# Patient Record
Sex: Female | Born: 1995 | Race: Black or African American | Hispanic: No | Marital: Married | Smoking: Never smoker
Health system: Southern US, Community
[De-identification: ages and names within clinical notes are randomized; demographics above are authoritative.]

## PROBLEM LIST (undated history)

## (undated) DIAGNOSIS — N83209 Unspecified ovarian cyst, unspecified side: Secondary | ICD-10-CM

## (undated) DIAGNOSIS — E7800 Pure hypercholesterolemia, unspecified: Secondary | ICD-10-CM

---

## 2014-09-16 ENCOUNTER — Emergency Department (HOSPITAL_COMMUNITY)
Admission: EM | Admit: 2014-09-16 | Discharge: 2014-09-16 | Disposition: A | Discharge status: Home / Self Care | Payer: BLUE CROSS/BLUE SHIELD | Attending: Emergency Medicine | Admitting: Emergency Medicine

## 2014-09-16 ENCOUNTER — Emergency Department (HOSPITAL_COMMUNITY): Payer: BLUE CROSS/BLUE SHIELD

## 2014-09-16 ENCOUNTER — Encounter (HOSPITAL_COMMUNITY): Payer: Self-pay | Admitting: Emergency Medicine

## 2014-09-16 DIAGNOSIS — Z3A00 Weeks of gestation of pregnancy not specified: Secondary | ICD-10-CM | POA: Diagnosis not present

## 2014-09-16 DIAGNOSIS — O209 Hemorrhage in early pregnancy, unspecified: Secondary | ICD-10-CM | POA: Diagnosis present

## 2014-09-16 DIAGNOSIS — N939 Abnormal uterine and vaginal bleeding, unspecified: Secondary | ICD-10-CM

## 2014-09-16 DIAGNOSIS — IMO0002 Reserved for concepts with insufficient information to code with codable children: Secondary | ICD-10-CM

## 2014-09-16 LAB — CBC WITH DIFFERENTIAL/PLATELET
BASOS ABS: 0 10*3/uL (ref 0.0–0.1)
BASOS PCT: 1 % (ref 0–1)
EOS ABS: 0.1 10*3/uL (ref 0.0–0.7)
Eosinophils Relative: 1 % (ref 0–5)
HCT: 36 % (ref 36.0–46.0)
HEMOGLOBIN: 12 g/dL (ref 12.0–15.0)
Lymphocytes Relative: 42 % (ref 12–46)
Lymphs Abs: 2.5 10*3/uL (ref 0.7–4.0)
MCH: 30.1 pg (ref 26.0–34.0)
MCHC: 33.3 g/dL (ref 30.0–36.0)
MCV: 90.2 fL (ref 78.0–100.0)
Monocytes Absolute: 0.4 10*3/uL (ref 0.1–1.0)
Monocytes Relative: 6 % (ref 3–12)
NEUTROS PCT: 50 % (ref 43–77)
Neutro Abs: 3 10*3/uL (ref 1.7–7.7)
Platelets: 336 10*3/uL (ref 150–400)
RBC: 3.99 MIL/uL (ref 3.87–5.11)
RDW: 12.7 % (ref 11.5–15.5)
WBC: 5.9 10*3/uL (ref 4.0–10.5)

## 2014-09-16 LAB — WET PREP, GENITAL
Clue Cells Wet Prep HPF POC: NONE SEEN
TRICH WET PREP: NONE SEEN
YEAST WET PREP: NONE SEEN

## 2014-09-16 LAB — COMPREHENSIVE METABOLIC PANEL
ALBUMIN: 3.4 g/dL — AB (ref 3.5–5.0)
ALK PHOS: 51 U/L (ref 38–126)
ALT: 11 U/L — AB (ref 14–54)
ANION GAP: 7 (ref 5–15)
AST: 17 U/L (ref 15–41)
BUN: 15 mg/dL (ref 6–20)
CALCIUM: 8.7 mg/dL — AB (ref 8.9–10.3)
CO2: 20 mmol/L — AB (ref 22–32)
Chloride: 111 mmol/L (ref 101–111)
Creatinine, Ser: 0.89 mg/dL (ref 0.44–1.00)
GFR calc Af Amer: >60 mL/min (ref >60)
GFR calc non Af Amer: >60 mL/min (ref >60)
GLUCOSE: 60 mg/dL — AB (ref 65–99)
Potassium: 3.8 mmol/L (ref 3.5–5.1)
SODIUM: 138 mmol/L (ref 135–145)
Total Bilirubin: 0.4 mg/dL (ref 0.3–1.2)
Total Protein: 6.3 g/dL — ABNORMAL LOW (ref 6.5–8.1)

## 2014-09-16 LAB — URINE MICROSCOPIC-ADD ON

## 2014-09-16 LAB — URINALYSIS, ROUTINE W REFLEX MICROSCOPIC
BILIRUBIN URINE: NEGATIVE
Glucose, UA: NEGATIVE mg/dL
Ketones, ur: NEGATIVE mg/dL
NITRITE: NEGATIVE
PH: 7 (ref 5.0–8.0)
Protein, ur: 100 mg/dL — AB
SPECIFIC GRAVITY, URINE: 1.03 (ref 1.005–1.030)
Urobilinogen, UA: 1 mg/dL (ref 0.0–1.0)

## 2014-09-16 LAB — GC/CHLAMYDIA PROBE AMP (~~LOC~~) NOT AT ARMC
CHLAMYDIA, DNA PROBE: NEGATIVE
Neisseria Gonorrhea: NEGATIVE

## 2014-09-16 LAB — HCG, QUANTITATIVE, PREGNANCY: hCG, Beta Chain, Quant, S: 4004 m[IU]/mL — ABNORMAL HIGH (ref <5)

## 2014-09-16 LAB — POC URINE PREG, ED: Preg Test, Ur: POSITIVE — AB

## 2014-09-16 LAB — ABO/RH: ABO/RH(D): B POS

## 2014-09-16 MED ORDER — IBUPROFEN 800 MG PO TABS: 800.0000 mg | ORAL_TABLET | Freq: Three times a day (TID) | ORAL | Status: DC | PRN

## 2014-09-16 MED ORDER — MISOPROSTOL 200 MCG PO TABS: 600.0000 ug | ORAL_TABLET | Freq: Once | ORAL | Status: DC

## 2014-09-16 MED ORDER — ONDANSETRON 4 MG PO TBDP: 4.0000 mg | ORAL_TABLET | Freq: Three times a day (TID) | ORAL | Status: DC | PRN

## 2014-09-16 NOTE — Discharge Instructions (Signed)
You were seen in the emergency for vaginal bleeding. It appears on ultrasound that you still have retained products of conception. I recommend you take another dose of Cytotec 600 micrograms. You may take this either by mouth or Place these pills inside of your vagina. I recommend you follow-up with women's clinic for close follow-up to make sure that the Cytotec works. If the Cytotec does not clear all the products of conception, you may need a surgical procedure SEEK IMMEDIATE MEDICAL CARE IF:   You pass out.   You are changing pads every 15 to 30 minutes.   You have abdominal pain.  You have a fever.   You become sweaty or weak.   You are passing large blood clots from the vagina.   You start to feel nauseous and vomit. MAKE SURE YOU:   Understand these instructions.  Will watch your condition.  Will get help right away if you are not doing well or get worse. Document Released: 12/20/2004 Document Revised: 12/25/2012 Document Reviewed: 07/19/2012 [facility] Patient Information 2015 [place], [place]. This information is not intended to replace advice given to you by your health care provider. Make sure you discuss any questions you have with your health care provider.

## 2014-09-16 NOTE — ED Provider Notes (Signed)
This chart was scribed for  Layla Maw Kleber Crean, DO by Bethel Born, ED Scribe. This patient was seen in room D30C/D30C and the patient's care was started at 3:28 AM.  TIME SEEN: 3:28 AM  CHIEF COMPLAINT: Vaginal bleeding  HPI: Stacie Patterson is a 19 y.o. female who presents to the Emergency Department complaining of vaginal bleeding with onset 3 weeks ago (08/29/14) after a medical abortion at [facility]. The bleeding had slowed down but increased yesterday. She has been passing clots and used 4 heavy pads today. Movement exacerbates the bleeding. Associated symptoms include abdominal cramping. Pt denies dysuria, hematuria, and other vaginal discharge. She followed up at [facility] 2 days ago where she had an ultrasound and pelvic exam and was told that her uterus was empty. LNMP 07/13/14. G1P0A1. No STD history. States she did have intercourse once after her abortion.  ROS: See HPI Constitutional: no fever  Eyes: no drainage  ENT: no runny nose   Cardiovascular:  no chest pain  Resp: no SOB  GI: no vomiting GU: no dysuria Integumentary: no rash  Allergy: no hives  Musculoskeletal: no leg swelling  Neurological: no slurred speech ROS otherwise negative  PAST MEDICAL HISTORY/PAST SURGICAL HISTORY:  History reviewed. No pertinent past medical history.  MEDICATIONS:  Prior to Admission medications   Not on File    ALLERGIES:  No Known Allergies  SOCIAL HISTORY:  Social History  Substance Use Topics  . Smoking status: Never Smoker   . Smokeless tobacco: Not on file  . Alcohol Use: No    FAMILY HISTORY: No family history on file.  EXAM: BP 113/61 mmHg  Pulse 78  Temp(Src) 98.1 F (36.7 C) (Oral)  Resp 16  Ht 5' 10.5" (1.791 m)  Wt 205 lb (92.987 kg)  BMI 28.99 kg/m2  SpO2 100%  LMP 07/13/2014 CONSTITUTIONAL: Alert and oriented and responds appropriately to questions. Well-appearing; well-nourished HEAD: Normocephalic EYES: Conjunctivae clear, PERRL ENT: normal nose;  no rhinorrhea; moist mucous membranes; pharynx without lesions noted NECK: Supple, no meningismus, no LAD  CARD: RRR; S1 and S2 appreciated; no murmurs, no clicks, no rubs, no gallops RESP: Normal chest excursion without splinting or tachypnea; breath sounds clear and equal bilaterally; no wheezes, no rhonchi, no rales, no hypoxia or respiratory distress, speaking full sentences ABD/GI: Normal bowel sounds; non-distended; soft, non-tender, no rebound, no guarding, no peritoneal signs GU:  Normal external genitalia, mild to moderate amount of dark red blood coming from the cervical os without clots, no hemorrhage, no adnexal tenderness or fullness, no cervical motion tenderness, no other vaginal discharge noted, cervix is not dilated BACK:  The back appears normal and is non-tender to palpation, there is no CVA tenderness EXT: Normal ROM in all joints; non-tender to palpation; no edema; normal capillary refill; no cyanosis, no calf tenderness or swelling    SKIN: Normal color for age and race; warm NEURO: Moves all extremities equally, sensation to light touch intact diffusely, cranial nerves II through XII intact PSYCH: The patient's mood and manner are appropriate. Grooming and personal hygiene are appropriate.  MEDICAL DECISION MAKING: Pt here with increasing vaginal bleeding after abortion on August 26. Reports she took pills. Was seen at [facility] 2 days ago and states she had an ultrasound which showed that her "uterus was empty". She did have sexual intercourse once after her abortion. No cervical motion tenderness, adnexal tenderness or fullness on exam. Labs ordered in triage are unremarkable. Pelvic cultures and urine pending. Discussed with patient  that this bleeding could be from her next menstrual cycle but given recent negative ultrasound I have low suspicion that there retained products of conception.  ED PROGRESS: Patient's urine pregnancy test is positive. A quantitative hCG is  greater than 4000. Concern that patient may have a new pregnancy versus retained product of conception. Will obtain pelvic ultrasound. Her wet prep is negative except for few WBCs. Gonorrhea and chlamydia cultures are pending. Urine shows too numerous to count RBCs and small leukocytes likely from her vaginal bleeding. Rare bacteria seen.  She is B+, does not need RhoGAM.   7:15 AM  Pt's pelvic ultrasound shows findings concerning for retained products of conception with heterogeneous debris within a thickened endometrium. She also has cystic tubular structures on both ovaries larger on the right than the left have appearance of hydrosalpinx. She had no adnexal tenderness on exam. Gonorrhea and chlamydia cultures are pending. Have discussed the findings of retained products of conception with Dr. Adrian Blackwater with OB/GYN. He recommends giving the patient another 600 mcg of Cytotec to take either orally or intravaginally. He recommends as outpatient follow-up with women's hospital as she may need a D&C.  Discussed these findings with patient. I feel she is safe to be discharged home. Her bleeding here in the emergency department has been mild and she has a hemoglobin of 12 and is hemodynamically stable. Discussed bleeding return precautions with patient. Will get outpatient follow-up information. We'll discharge with Cytotec and Zofran. She verbalized understanding and is comfortable with this plan.       I personally performed the services described in this documentation, which was scribed in my presence. The recorded information has been reviewed and is accurate.      Layla Maw Dorethea Strubel, DO 09/16/14 [phone]

## 2014-09-16 NOTE — ED Notes (Signed)
Pt back from US

## 2014-09-16 NOTE — ED Notes (Signed)
Pt. reports heavy vaginal bleeding with low abdominal cramping onset several weeks ago.  Abortion last 08/29/2014 , denies fever or chills. No dysuria .

## 2014-09-16 NOTE — ED Notes (Signed)
Patient transported to Ultrasound 

## 2016-04-13 ENCOUNTER — Ambulatory Visit (HOSPITAL_COMMUNITY)
Admission: EM | Admit: 2016-04-13 | Discharge: 2016-04-13 | Disposition: A | Discharge status: Home / Self Care | Payer: BLUE CROSS/BLUE SHIELD

## 2017-05-02 IMAGING — US US OB TRANSVAGINAL
1 series · 13 of 28 positions shown · non-contrast
Comparison: None.

CLINICAL DATA: Persistent vaginal bleeding and positive pregnancy
test post medical abortion 08/29/2014.

EXAM:
OBSTETRIC <14 WK US AND TRANSVAGINAL OB US
TECHNIQUE: Both transabdominal and transvaginal ultrasound examinations were
performed for complete evaluation of the gestation as well as the
maternal uterus, adnexal regions, and pelvic cul-de-sac.
Transvaginal technique was performed to assess early pregnancy.

[Series 1: us ob transvaginal · 0.24mm/px · 13 of 68 slices shown]
[im 3/68]
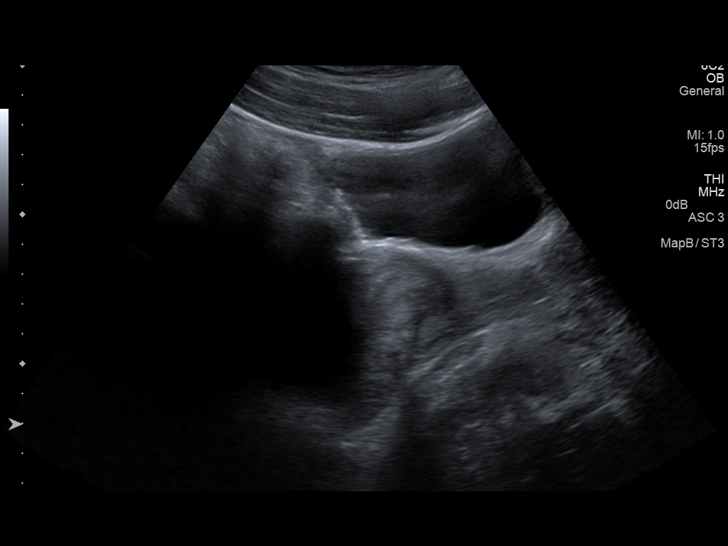
[im 8/68]
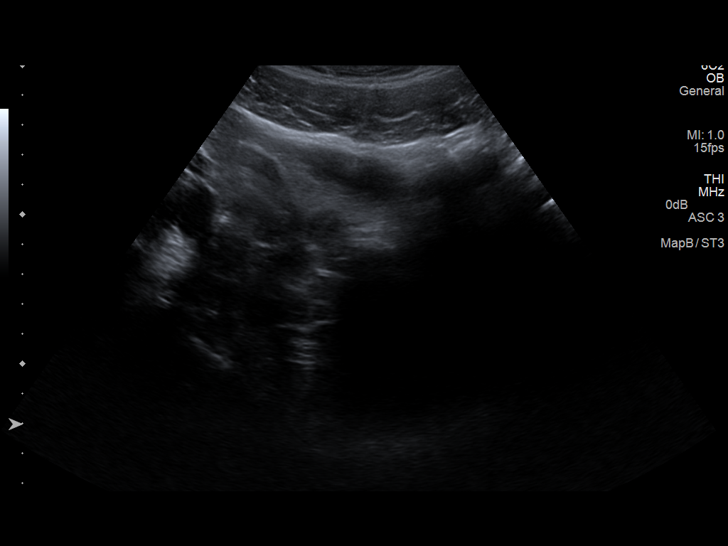
[im 13/68]
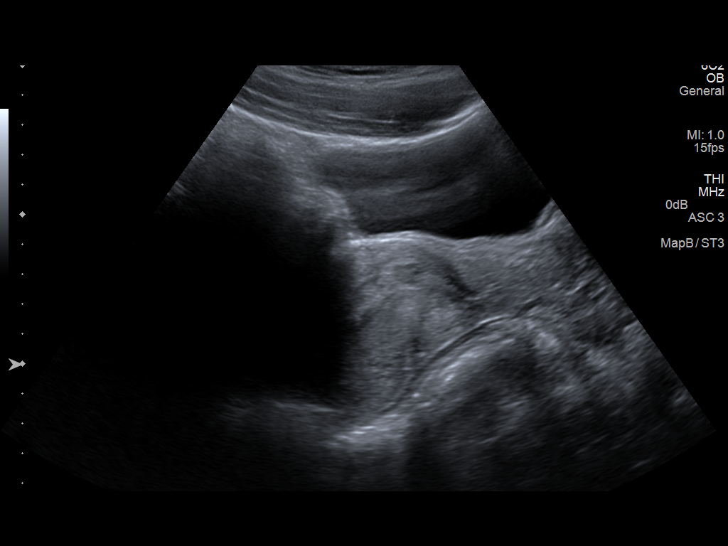
[im 18/68]
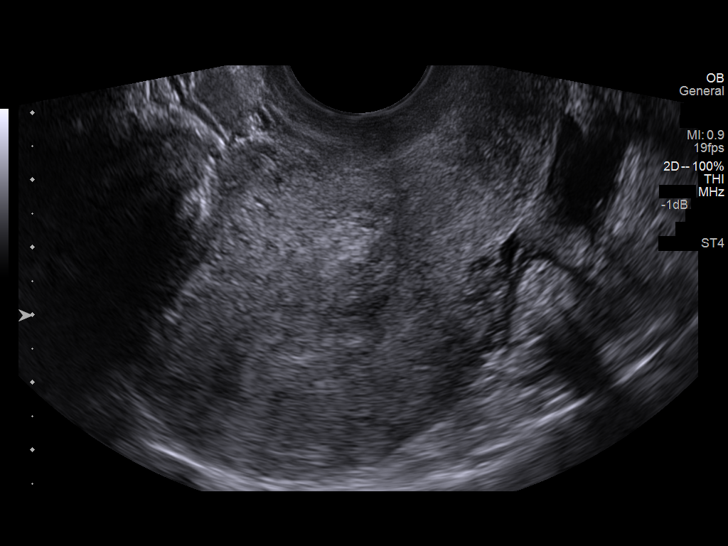
[im 23/68]
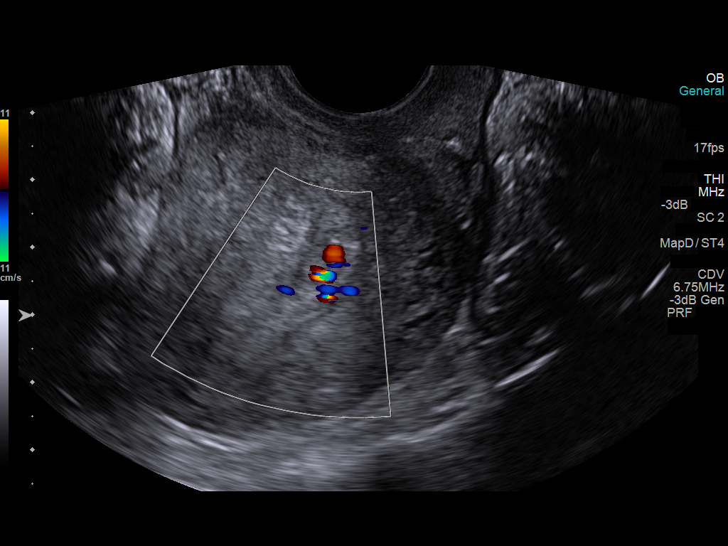
[im 28/68]
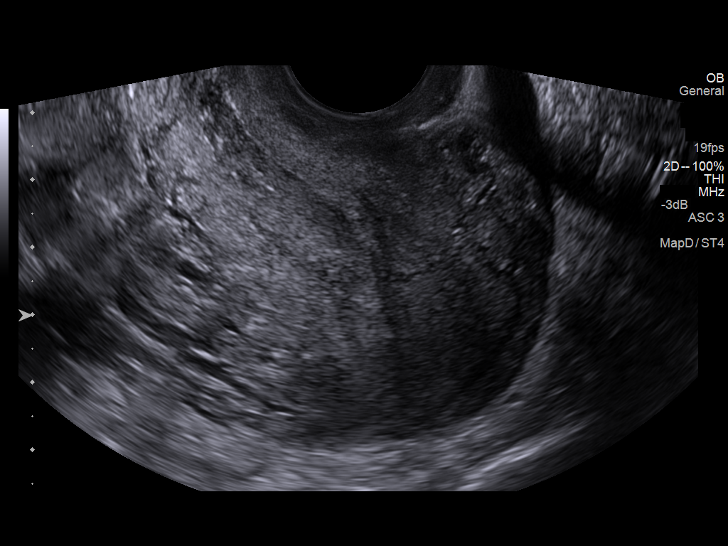
[im 35/68]
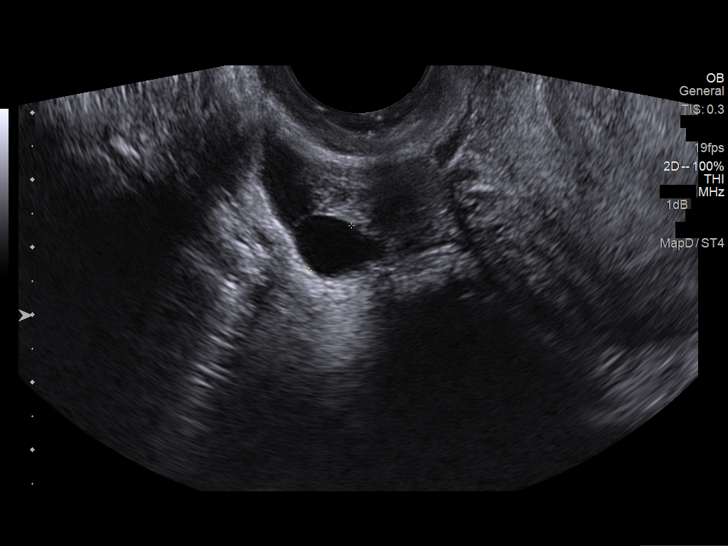
[im 40/68]
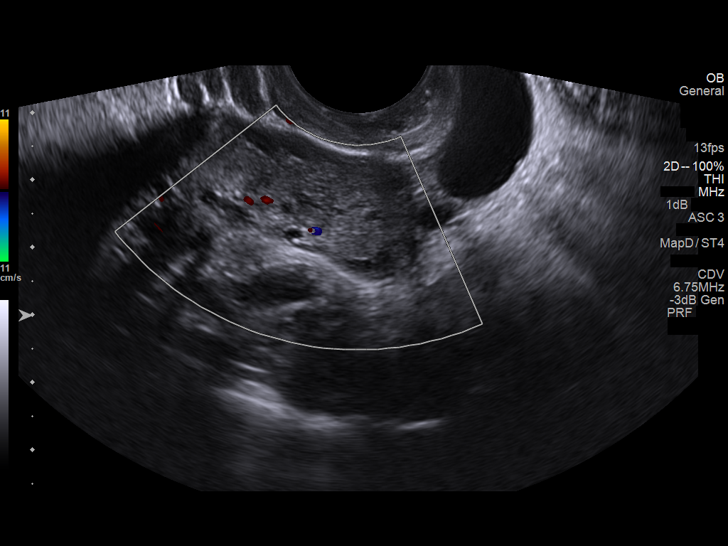
[im 45/68]
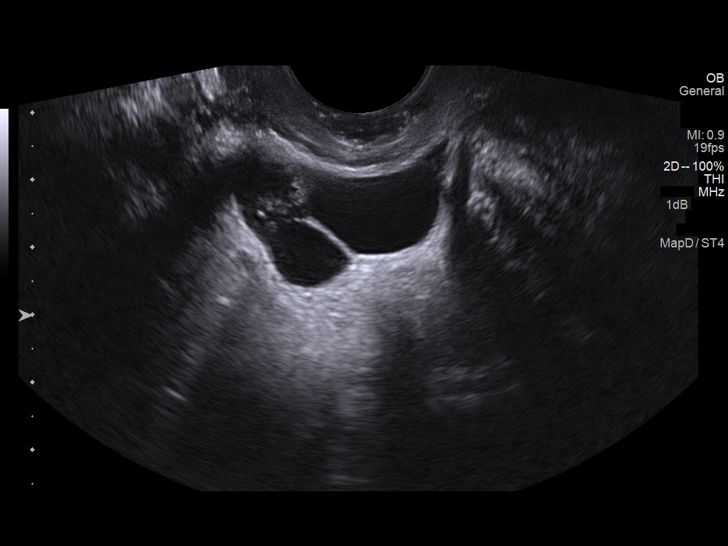
[im 50/68]
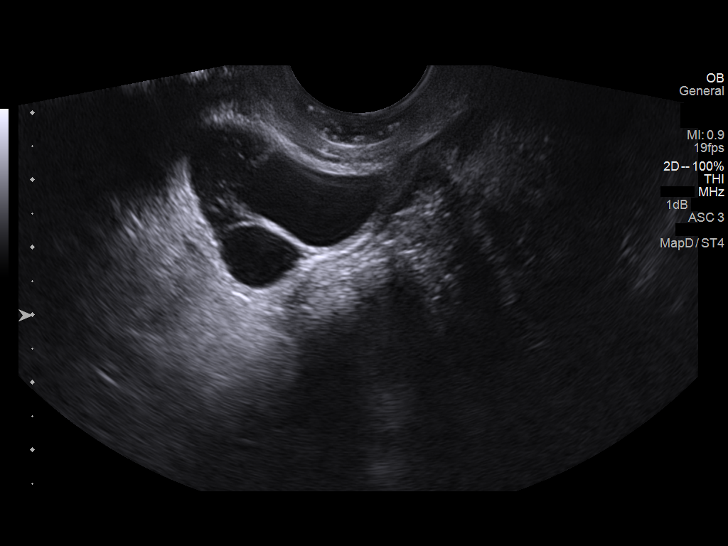
[im 55/68]
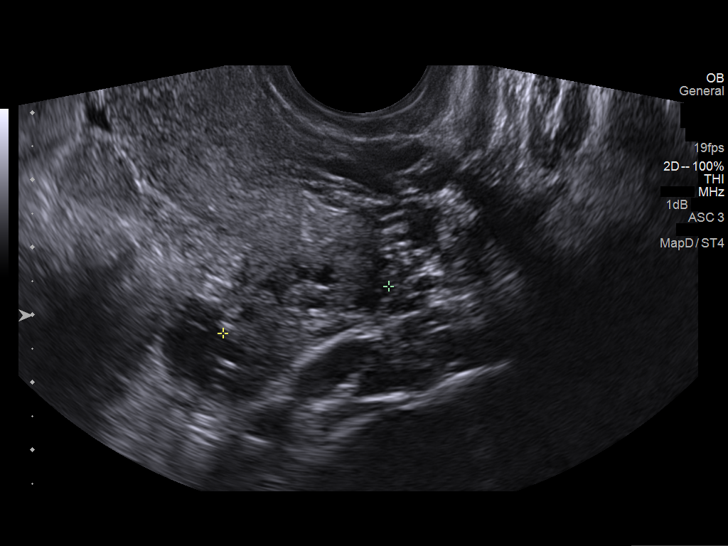
[im 60/68]
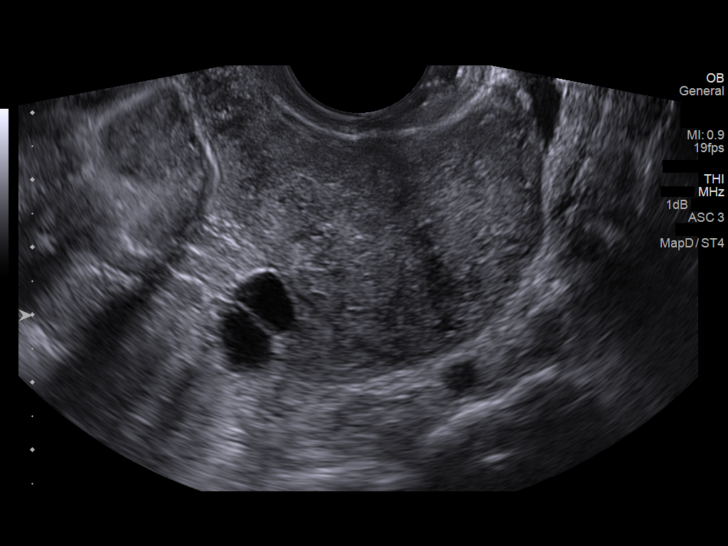
[im 65/68]
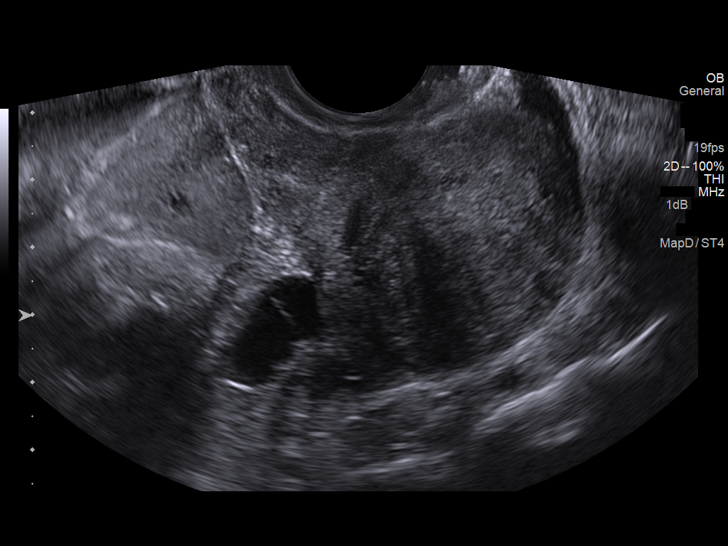

[13 of 28 positions shown; findings below may reference images not displayed]

FINDINGS: Intrauterine gestational sac: Not present.

Maternal uterus/adnexae: The endometrium is thickened measuring up
to 1.6 cm with heterogeneous echogenic debris. There is some blood
flow noted to the endometrium, concerning for retained products of
conception.

The right ovary measures 2.7 x 1.9 x 1.8 cm. Blood flow seen to the
ovarian parenchyma. There adjacent cystic structures throughout
appear tubular and are suspicious for hydrosalpinx versus
paraovarian cysts. The left ovary measures 2.0 x 1.7 x 2.6 cm with
normal blood flow. There are similar cystic structures adjacent to
the left ovary, again hydrosalpinx versus para ovarian cysts. No
pelvic free fluid.
IMPRESSION: 1. Findings concerning for retained products of conception with
heterogeneous debris within thickened endometrium and some internal
blood flow.
2. Cystic tubular structures adjacent to both ovaries, larger on the
right than left. These have the appearance of hydrosalpinx, less
likely a para ovarian cyst. The ovaries themselves appear normal
with blood flow.

## 2023-08-19 ENCOUNTER — Encounter: Payer: Self-pay | Admitting: Emergency Medicine

## 2023-08-19 ENCOUNTER — Ambulatory Visit
Admission: EM | Admit: 2023-08-19 | Discharge: 2023-08-19 | Disposition: A | Discharge status: Home / Self Care | Attending: Emergency Medicine | Admitting: Emergency Medicine

## 2023-08-19 DIAGNOSIS — N898 Other specified noninflammatory disorders of vagina: Secondary | ICD-10-CM | POA: Insufficient documentation

## 2023-08-19 HISTORY — DX: Unspecified ovarian cyst, unspecified side: N83.209

## 2023-08-19 HISTORY — DX: Pure hypercholesterolemia, unspecified: E78.00

## 2023-08-19 NOTE — ED Triage Notes (Signed)
 Patient reports lesion between buttocks, by anus and outside vaginal opening x 1 week ago. Patient reports that she has had this 2 years and was told it was just irritation no STD. Denies pain. Patient itching in areas only at night.

## 2023-08-19 NOTE — Discharge Instructions (Signed)
 Today you were evaluated for the open sores in your genitalia which are concerning for herpetic lesions, more information in your packet regarding this  Swab is pending 2 to 3 days you will be notified of positive test results only, may see all lab results in your MyChart account, at time of notification treatment will be discussed with the and any further questions answered  To help with general discomfort until labs result you may use topical medications on the external area of your genitalia such as lidocaine to help with irritation  May follow-up with urgent care as needed

## 2023-08-19 NOTE — ED Provider Notes (Signed)
 [facility]    CSN: [id number] Arrival date & time: 08/19/23  1238      History   Chief Complaint Chief Complaint  Patient presents with   Vaginal Itching    HPI Stacie Patterson is a 28 y.o. female.    Vaginal Itching    Past Medical History:  Diagnosis Date   High cholesterol    Ovarian cyst     There are no active problems to display for this patient.   History reviewed. No pertinent surgical history.  OB History   No obstetric history on file.      Home Medications    Prior to Admission medications   Medication Sig Start Date End Date Taking? Authorizing Provider  ibuprofen (ADVIL,MOTRIN) 800 MG tablet Take 1 tablet (800 mg total) by mouth every 8 (eight) hours as needed for mild pain. 09/16/14   Ward, Josette SAILOR, DO  misoprostol (CYTOTEC) 200 MCG tablet Take 3 tablets (600 mcg total) by mouth once. 09/16/14   Ward, Josette SAILOR, DO  ondansetron (ZOFRAN ODT) 4 MG disintegrating tablet Take 1 tablet (4 mg total) by mouth every 8 (eight) hours as needed for nausea or vomiting. 09/16/14   Ward, Josette SAILOR, DO    Family History History reviewed. No pertinent family history.  Social History Social History   Tobacco Use   Smoking status: Never   Smokeless tobacco: Never  Vaping Use   Vaping status: Never Used  Substance Use Topics   Alcohol use: No   Drug use: Yes    Types: Marijuana     Allergies   Patient has no known allergies.   Review of Systems Review of Systems   Physical Exam Triage Vital Signs ED Triage Vitals  Encounter Vitals Group     BP 08/19/23 1246 110/78     Girls Systolic BP Percentile --      Girls Diastolic BP Percentile --      Boys Systolic BP Percentile --      Boys Diastolic BP Percentile --      Pulse Rate 08/19/23 1246 74     Resp 08/19/23 1246 18     Temp 08/19/23 1246 98.1 F (36.7 C)     Temp Source 08/19/23 1246 Oral     SpO2 08/19/23 1246 99 %     Weight --      Height --      Head Circumference  --      Peak Flow --      Pain Score 08/19/23 1252 0     Pain Loc --      Pain Education --      Exclude from Growth Chart --    No data found.  Updated Vital Signs BP 110/78 (BP Location: Right Arm)   Pulse 74   Temp 98.1 F (36.7 C) (Oral)   Resp 18   LMP 07/23/2023 (Exact Date)   SpO2 99%   Visual Acuity Right Eye Distance:   Left Eye Distance:   Bilateral Distance:    Right Eye Near:   Left Eye Near:    Bilateral Near:     Physical Exam Constitutional:      Appearance: Normal appearance.  Eyes:     Extraocular Movements: Extraocular movements intact.  Genitourinary:    Comments: Open blisters present to the labia and the anus Neurological:     Mental Status: She is alert and oriented to person, place, and time.      UC  Treatments / Results  Labs (all labs ordered are listed, but only abnormal results are displayed) Labs Reviewed  HSV 1/2 PCR (SURFACE)    EKG   Radiology No results found.  Procedures Procedures (including critical care time)  Medications Ordered in UC Medications - No data to display  Initial Impression / Assessment and Plan / UC Course  I have reviewed the triage vital signs and the nursing notes.  Pertinent labs & imaging results that were available during my care of the patient were reviewed by me and considered in my medical decision making (see chart for details).  Vaginal lesion  Presentation concerning for herpes, discussed, HSV 1 and 2 culturing obtained, pending, will initiate antiviral if positive, at this time patient had no questions regarding potential diagnosis, recommended supportive care for discomfort, PCP appointment made prior to discharge Final Clinical Impressions(s) / UC Diagnoses   Final diagnoses:  Vaginal lesion     Discharge Instructions      Today you were evaluated for the open sores in your genitalia which are concerning for herpetic lesions, more information in your packet regarding  this  Swab is pending 2 to 3 days you will be notified of positive test results only, may see all lab results in your MyChart account, at time of notification treatment will be discussed with the and any further questions answered  To help with general discomfort until labs result you may use topical medications on the external area of your genitalia such as lidocaine to help with irritation  May follow-up with urgent care as needed   ED Prescriptions   None    PDMP not reviewed this encounter.   Teresa Shelba SAUNDERS, NP 08/19/23 1349

## 2023-08-20 LAB — HSV 1/2 PCR (SURFACE)
HSV-1 DNA: DETECTED — AB
HSV-2 DNA: NOT DETECTED

## 2023-08-22 ENCOUNTER — Ambulatory Visit (HOSPITAL_COMMUNITY): Payer: Self-pay

## 2023-08-22 MED ORDER — VALACYCLOVIR HCL 1 G PO TABS: 1000.0000 mg | ORAL_TABLET | Freq: Two times a day (BID) | ORAL | 0 refills | Status: AC

## 2023-10-06 ENCOUNTER — Encounter: Payer: Self-pay | Admitting: General Practice

## 2023-10-06 ENCOUNTER — Ambulatory Visit: Admitting: General Practice

## 2023-10-06 VITALS — BP 122/80 | HR 102 | Temp 98.4°F | Ht 69.0 in | Wt 199.0 lb

## 2023-10-06 DIAGNOSIS — N83209 Unspecified ovarian cyst, unspecified side: Secondary | ICD-10-CM | POA: Diagnosis not present

## 2023-10-06 DIAGNOSIS — Z114 Encounter for screening for human immunodeficiency virus [HIV]: Secondary | ICD-10-CM | POA: Diagnosis not present

## 2023-10-06 DIAGNOSIS — L819 Disorder of pigmentation, unspecified: Secondary | ICD-10-CM | POA: Diagnosis not present

## 2023-10-06 DIAGNOSIS — R21 Rash and other nonspecific skin eruption: Secondary | ICD-10-CM

## 2023-10-06 DIAGNOSIS — Z7689 Persons encountering health services in other specified circumstances: Secondary | ICD-10-CM | POA: Diagnosis not present

## 2023-10-06 DIAGNOSIS — Z1159 Encounter for screening for other viral diseases: Secondary | ICD-10-CM | POA: Diagnosis not present

## 2023-10-06 DIAGNOSIS — L659 Nonscarring hair loss, unspecified: Secondary | ICD-10-CM | POA: Diagnosis not present

## 2023-10-06 DIAGNOSIS — L853 Xerosis cutis: Secondary | ICD-10-CM

## 2023-10-06 LAB — CBC
HCT: 40.6 % (ref 36.0–46.0)
Hemoglobin: 13.6 g/dL (ref 12.0–15.0)
MCHC: 33.5 g/dL (ref 30.0–36.0)
MCV: 90.8 fl (ref 78.0–100.0)
Platelets: 278 K/uL (ref 150.0–400.0)
RBC: 4.48 Mil/uL (ref 3.87–5.11)
RDW: 13.2 % (ref 11.5–15.5)
WBC: 2.7 K/uL — ABNORMAL LOW (ref 4.0–10.5)

## 2023-10-06 LAB — COMPREHENSIVE METABOLIC PANEL WITH GFR
ALT: 10 U/L (ref 0–35)
AST: 14 U/L (ref 0–37)
Albumin: 4 g/dL (ref 3.5–5.2)
Alkaline Phosphatase: 43 U/L (ref 39–117)
BUN: 13 mg/dL (ref 6–23)
CO2: 27 meq/L (ref 19–32)
Calcium: 9.1 mg/dL (ref 8.4–10.5)
Chloride: 104 meq/L (ref 96–112)
Creatinine, Ser: 0.94 mg/dL (ref 0.40–1.20)
GFR: 82.02 mL/min (ref >60.00)
Glucose, Bld: 85 mg/dL (ref 70–99)
Potassium: 4.7 meq/L (ref 3.5–5.1)
Sodium: 137 meq/L (ref 135–145)
Total Bilirubin: 0.5 mg/dL (ref 0.2–1.2)
Total Protein: 6.9 g/dL (ref 6.0–8.3)

## 2023-10-06 LAB — TSH: TSH: 1.26 u[IU]/mL (ref 0.35–5.50)

## 2023-10-06 LAB — VITAMIN B12: Vitamin B-12: 598 pg/mL (ref 211–911)

## 2023-10-06 LAB — VITAMIN D 25 HYDROXY (VIT D DEFICIENCY, FRACTURES): VITD: 38.5 ng/mL (ref 30.00–100.00)

## 2023-10-06 NOTE — Patient Instructions (Signed)
 Stop by the lab prior to leaving today. I will notify you of your results once received.   Schedule physical in 2 weeks. Come fasting that day for cholesterol check.   You will either be contacted via phone regarding your referral to GYN , or you may receive a letter on your MyChart portal from our referral team with instructions for scheduling an appointment. Please let us  know if you have not been contacted by anyone within two weeks.  It was a pleasure to meet you today! Please don't hesitate to contact me with any questions. Welcome to [facility]!

## 2023-10-06 NOTE — Progress Notes (Signed)
 New Patient Office Visit  Subjective    Patient ID: Stacie Patterson, female    DOB: Aug 07, 1995  Age: 28 y.o. MRN: [id number]  CC:  Chief Complaint  Patient presents with   New Patient (Initial Visit)   Rash    Hyperpigmentation, hair loss, itchy skin, butterfly rash, dry skin, inflammation and redness at joints, ovarian cysts that come and go. Did see obgyn at [facility] in 2024 but has not went back. Patient went to [facility] in August for cyst as well.     HPI Stacie Patterson is a 28 y.o. female presents to establish care.  Previous PCP/physical/labs: none  Discussed the use of AI scribe software for clinical note transcription with the patient, who gave verbal consent to proceed.  History of Present Illness Stacie Patterson is a 28 year old female who presents with concerns about a rash and possible PCOS or autoimmune condition.  She has experienced a 'butterfly rash,' joint inflammation, itchy skin, dry skin, hyperpigmentation, and hair loss, particularly on the sides of her head, since 2018-2019 during her college years. No current symptoms of rash but ongoing hair loss, hyperpigmentation, and dry skin.  She reports recurrent ovarian cysts over the past three years and describes her periods as 'clotty'. She was previously seen by OBGYN at [facility] but would like to establish with a new one in [place].  She has experienced chest pains in the past, for which she visited the ER and was diagnosed with costochondritis.  She recalls being 'severely vitamin D deficient' during college, for which she was treated at that time.    Outpatient Encounter Medications as of 10/06/2023  Medication Sig   [DISCONTINUED] ibuprofen  (ADVIL ,MOTRIN ) 800 MG tablet Take 1 tablet (800 mg total) by mouth every 8 (eight) hours as needed for mild pain.   [DISCONTINUED] misoprostol  (CYTOTEC ) 200 MCG tablet Take 3 tablets (600 mcg total) by mouth once.   [DISCONTINUED]  ondansetron  (ZOFRAN  ODT) 4 MG disintegrating tablet Take 1 tablet (4 mg total) by mouth every 8 (eight) hours as needed for nausea or vomiting.   No facility-administered encounter medications on file as of 10/06/2023.    Past Medical History:  Diagnosis Date   High cholesterol    Ovarian cyst     History reviewed. No pertinent surgical history.  Family History  Problem Relation Age of Onset   Depression Mother    Asthma Mother    Miscarriages / [place] Mother    Obesity Mother    Asthma Brother    Obesity Brother     Social History   Socioeconomic History   Marital status: Married    Spouse name: Devante   Number of children: Not on file   Years of education: Not on file   Highest education level: Bachelor's degree (e.g., BA, AB, BS)  Occupational History   Not on file  Tobacco Use   Smoking status: Never   Smokeless tobacco: Never  Vaping Use   Vaping status: Never Used  Substance and Sexual Activity   Alcohol use: Not Currently   Drug use: Not Currently    Types: Marijuana   Sexual activity: Yes    Birth control/protection: Coitus interruptus, None  Other Topics Concern   Not on file  Social History Narrative   Not on file   Social Drivers of Health   Financial Resource Strain: Low Risk  (10/05/2023)   Overall Financial Resource Strain (CARDIA)    Difficulty of Paying Living Expenses: Not very hard  Food Insecurity: No Food Insecurity (10/05/2023)   Hunger Vital Sign    Worried About Running Out of Food in the Last Year: Never true    Ran Out of Food in the Last Year: Never true  Transportation Needs: No Transportation Needs (10/05/2023)   PRAPARE - Administrator, Civil Service (Medical): No    Lack of Transportation (Non-Medical): No  Physical Activity: Sufficiently Active (10/05/2023)   Exercise Vital Sign    Days of Exercise per Week: 4 days    Minutes of Exercise per Session: 40 min  Stress: Stress Concern Present (10/05/2023)    [employer] of Occupational Health - Occupational Stress Questionnaire    Feeling of Stress: To some extent  Social Connections: Moderately Integrated (10/05/2023)   Social Connection and Isolation Panel    Frequency of Communication with Friends and Family: More than three times a week    Frequency of Social Gatherings with Friends and Family: Once a week    Attends Religious Services: More than 4 times per year    Active Member of [employer] or Organizations: No    Attends Engineer, structural: Not on file    Marital Status: Married  Catering manager Violence: Not At Risk (02/26/2022)   Received from [facility]   HITS    Over the last 12 months how often did your partner physically hurt you?: Never    Over the last 12 months how often did your partner insult you or talk down to you?: Never    Over the last 12 months how often did your partner threaten you with physical harm?: Never    Over the last 12 months how often did your partner scream or curse at you?: Never    Review of Systems  Constitutional:  Negative for chills and fever.  Respiratory:  Negative for shortness of breath.   Cardiovascular:  Negative for chest pain.  Gastrointestinal:  Negative for abdominal pain, constipation, diarrhea, heartburn, nausea and vomiting.  Genitourinary:  Negative for dysuria, frequency and urgency.  Neurological:  Negative for dizziness and headaches.  Endo/Heme/Allergies:  Negative for polydipsia.  Psychiatric/Behavioral:  Negative for depression and suicidal ideas. The patient is not nervous/anxious.         Objective    BP 122/80   Pulse (!) 102   Temp 98.4 F (36.9 C) (Oral)   Ht 5' 9 (1.753 m)   Wt 199 lb (90.3 kg)   SpO2 98%   BMI 29.39 kg/m   Physical Exam Vitals and nursing note reviewed.  Constitutional:      Appearance: Normal appearance.  Cardiovascular:     Rate and Rhythm: Normal rate and regular rhythm.     Pulses: Normal pulses.     Heart sounds:  Normal heart sounds.  Pulmonary:     Effort: Pulmonary effort is normal.     Breath sounds: Normal breath sounds.  Neurological:     Mental Status: She is alert and oriented to person, place, and time.  Psychiatric:        Mood and Affect: Mood normal.        Behavior: Behavior normal.        Thought Content: Thought content normal.        Judgment: Judgment normal.         Assessment & Plan:  Cyst of ovary, unspecified laterality -     Ambulatory referral to Gynecology  Establishing care with new doctor, encounter for  Hair  loss -     VITAMIN D 25 Hydroxy (Vit-D Deficiency, Fractures) -     Vitamin B12 -     TSH  Hyperpigmentation  Dry skin -     Comprehensive metabolic panel with GFR  Butterfly rash -     CBC -     Comprehensive metabolic panel with GFR -     ANA  Need for hepatitis C screening test -     Hepatitis C antibody  Screening for HIV (human immunodeficiency virus) -     HIV Antibody (routine testing w rflx)    Assessment and Plan Assessment & Plan Evaluation for possible autoimmune disease presenting with hair loss, hyperpigmentation, dry skin, and butterfly rash Symptoms suggest possible autoimmune disease. Differential includes vitamin deficiencies or PCOS. Autoimmune etiology considered. - Labs pending. - Await results.   Recurrent ovarian cysts Recurrent ovarian cysts reported over three years, unrelated to other symptoms. - Refer to gynecologist for evaluation and management.  Vitamin D deficiency (history of) Severe deficiency diagnosed ten years ago, suspected contribution to symptoms. - Order vitamin D level.  General Health Maintenance Due for routine health maintenance. Tdap in 2020. Declines flu shot. Pap smear due 2026. - Verify Tdap date and update records. - Discuss and document flu vaccination decision.   Return in about 2 weeks (around 10/20/2023) for physical and fasting labs. SABRA Carrol Aurora, NP

## 2023-10-08 LAB — ANA: Anti Nuclear Antibody (ANA): POSITIVE — AB

## 2023-10-08 LAB — ANTI-NUCLEAR AB-TITER (ANA TITER): ANA Titer 1: 1:40 {titer} — ABNORMAL HIGH

## 2023-10-08 LAB — HIV ANTIBODY (ROUTINE TESTING W REFLEX)
HIV 1&2 Ab, 4th Generation: NONREACTIVE
HIV FINAL INTERPRETATION: NEGATIVE

## 2023-10-08 LAB — HEPATITIS C ANTIBODY: Hepatitis C Ab: NONREACTIVE

## 2023-10-09 ENCOUNTER — Ambulatory Visit: Payer: Self-pay | Admitting: General Practice

## 2023-10-09 DIAGNOSIS — R7689 Other specified abnormal immunological findings in serum: Secondary | ICD-10-CM

## 2023-12-08 ENCOUNTER — Encounter: Payer: Self-pay | Admitting: *Deleted
